# Patient Record
Sex: Female | Born: 2005 | Race: Black or African American | Hispanic: No | Marital: Single | Smoking: Never smoker
Health system: Southern US, Community
[De-identification: ages and names within clinical notes are randomized; demographics above are authoritative.]

## PROBLEM LIST (undated history)

## (undated) DIAGNOSIS — J45909 Unspecified asthma, uncomplicated: Secondary | ICD-10-CM

## (undated) HISTORY — PX: HERNIA REPAIR: SHX51

---
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
## 2024-06-12 ENCOUNTER — Other Ambulatory Visit: Payer: Self-pay

## 2024-06-12 ENCOUNTER — Emergency Department (HOSPITAL_COMMUNITY): Admission: EM | Admit: 2024-06-12 | Discharge: 2024-06-12 | Disposition: A | Discharge status: Home / Self Care

## 2024-06-12 ENCOUNTER — Encounter (HOSPITAL_COMMUNITY): Payer: Self-pay

## 2024-06-12 ENCOUNTER — Emergency Department (HOSPITAL_COMMUNITY)

## 2024-06-12 DIAGNOSIS — Z79899 Other long term (current) drug therapy: Secondary | ICD-10-CM | POA: Insufficient documentation

## 2024-06-12 DIAGNOSIS — R0789 Other chest pain: Secondary | ICD-10-CM | POA: Insufficient documentation

## 2024-06-12 DIAGNOSIS — R079 Chest pain, unspecified: Secondary | ICD-10-CM

## 2024-06-12 LAB — CBC
HCT: 33.9 % — ABNORMAL LOW (ref 36.0–46.0)
Hemoglobin: 11.3 g/dL — ABNORMAL LOW (ref 12.0–15.0)
MCH: 29.4 pg (ref 26.0–34.0)
MCHC: 33.3 g/dL (ref 30.0–36.0)
MCV: 88.1 fL (ref 80.0–100.0)
Platelets: 381 K/uL (ref 150–400)
RBC: 3.85 MIL/uL — ABNORMAL LOW (ref 3.87–5.11)
RDW: 13.1 % (ref 11.5–15.5)
WBC: 5.1 K/uL (ref 4.0–10.5)
nRBC: 0 % (ref 0.0–0.2)

## 2024-06-12 LAB — BASIC METABOLIC PANEL WITH GFR
Anion gap: 7 (ref 5–15)
BUN: 10 mg/dL (ref 6–20)
CO2: 27 mmol/L (ref 22–32)
Calcium: 9.2 mg/dL (ref 8.9–10.3)
Chloride: 102 mmol/L (ref 98–111)
Creatinine, Ser: 0.82 mg/dL (ref 0.44–1.00)
GFR, Estimated: >60 mL/min (ref >60)
Glucose, Bld: 93 mg/dL (ref 70–99)
Potassium: 4.1 mmol/L (ref 3.5–5.1)
Sodium: 136 mmol/L (ref 135–145)

## 2024-06-12 LAB — D-DIMER, QUANTITATIVE: D-Dimer, Quant: 0.36 ug{FEU}/mL (ref 0.00–0.50)

## 2024-06-12 LAB — TROPONIN T, HIGH SENSITIVITY
Troponin T High Sensitivity: <15 ng/L (ref 0–19)
Troponin T High Sensitivity: <15 ng/L (ref 0–19)

## 2024-06-12 NOTE — ED Provider Notes (Signed)
 [place] EMERGENCY DEPARTMENT AT [facility] Provider Note   CSN: [id number] Arrival date & time: 06/12/24  1813     Patient presents with: Chest Pain   Katelyn Adams is a 18 y.o. female patient who presents to the emergency department today for further evaluation of left-sided chest pain that is been persistent and intermittent for the last year.  Patient was seen and evaluated by her pediatrician on 06/07/2024 for similar symptoms.  She was referred to cardiology and she has an appointment for next week.  Patient states that chest pain came back and lasted approximately an hour which prompted her arrival here.  She states typically the chest pain lasts only couple minutes.  Hurts worse to take deep breaths.  Does not hurt worse with movement.  Chest pain does sometimes radiate to the back.  Currently rates it 2/10 in severity.    Chest Pain      Prior to Admission medications   Medication Sig Start Date End Date Taking? Authorizing Provider  albuterol (VENTOLIN HFA) 108 (90 Base) MCG/ACT inhaler Inhale 2 puffs into the lungs every 6 (six) hours as needed for wheezing or shortness of breath.   Yes [provider]  cetirizine (ZYRTEC) 10 MG tablet Take 10 mg by mouth daily as needed for allergies or rhinitis.   Yes [provider]  hyoscyamine (LEVSIN SL) 0.125 MG SL tablet Place 0.125 mg under the tongue every 4 (four) hours as needed for cramping.   Yes [provider]  pantoprazole (PROTONIX) 20 MG tablet Take 20 mg by mouth daily as needed for heartburn or indigestion.   Yes [provider]  lidocaine  (LIDODERM ) 5 % Place 1 patch onto the skin daily. Remove & Discard patch within 12 hours or as directed by MD Patient not taking: Reported on 06/12/2024 08/29/20   Palumbo, April, MD  topiramate  (TOPAMAX ) 15 MG capsule Take 1 tablet at nighttime for one week then 2 tablets at nighttime Patient not taking: Reported on 06/12/2024  12/13/14   Susen Elsie DEL, MD    Allergies: Bean pod extract, Cabbage, Grass pollen([id number] vern), Pollen extract, and Other    Review of Systems  Cardiovascular:  Positive for chest pain.  All other systems reviewed and are negative.   Updated Vital Signs BP 122/87   Pulse 71   Temp 98.1 F (36.7 C) (Oral)   Resp 16   Ht 5' 3.5 (1.613 m)   Wt 59 kg   SpO2 100%   BMI 22.67 kg/m   Physical Exam Vitals and nursing note reviewed.  Constitutional:      General: She is not in acute distress.    Appearance: Normal appearance.  HENT:     Head: Normocephalic and atraumatic.  Eyes:     General:        Right eye: No discharge.        Left eye: No discharge.  Cardiovascular:     Comments: Regular rate and rhythm.  S1/S2 are distinct without any evidence of murmur, rubs, or gallops.  Radial pulses are 2+ bilaterally.  Dorsalis pedis pulses are 2+ bilaterally.  No evidence of pedal edema. Pulmonary:     Comments: Clear to auscultation bilaterally.  Normal effort.  No respiratory distress.  No evidence of wheezes, rales, or rhonchi heard throughout. Abdominal:     General: Abdomen is flat. Bowel sounds are normal. There is no distension.     Tenderness: There is no abdominal tenderness. There  is no guarding or rebound.  Musculoskeletal:        General: Normal range of motion.     Cervical back: Neck supple.  Skin:    General: Skin is warm and dry.     Findings: No rash.  Neurological:     General: No focal deficit present.     Mental Status: She is alert.  Psychiatric:        Mood and Affect: Mood normal.        Behavior: Behavior normal.     (all labs ordered are listed, but only abnormal results are displayed) Labs Reviewed  CBC - Abnormal; Notable for the following components:      Result Value   RBC 3.85 (*)    Hemoglobin 11.3 (*)    HCT 33.9 (*)    All other components within normal limits  BASIC METABOLIC PANEL WITH GFR  D-DIMER, QUANTITATIVE   TROPONIN T, HIGH SENSITIVITY  TROPONIN T, HIGH SENSITIVITY    EKG: EKG Interpretation Date/Time:  Tuesday June 12 2024 19:15:45 EST Ventricular Rate:  73 PR Interval:  143 QRS Duration:  90 QT Interval:  388 QTC Calculation: 428 R Axis:   78  Text Interpretation: Unknown rhythm, irregular rate No previous for comparison Confirmed by Gennaro Bouchard ([id number]) on 06/12/2024 7:25:43 PM  Radiology: [facility] Chest Port 1 View Result Date: 06/12/2024 EXAM: 1 VIEW(S) XRAY OF THE CHEST 06/12/2024 06:51:00 PM COMPARISON: None available. CLINICAL HISTORY: chest pain FINDINGS: LUNGS AND PLEURA: No focal pulmonary opacity. No pleural effusion. No pneumothorax. HEART AND MEDIASTINUM: No acute abnormality of the cardiac and mediastinal silhouettes. BONES AND SOFT TISSUES: No acute osseous abnormality. IMPRESSION: 1. No acute process. Electronically signed by: Dorethia Molt MD 06/12/2024 07:35 PM EST RP Workstation: [id number]     Procedures   Medications Ordered in the ED - No data to display  Medical Decision Making Katelyn Adams is a 18 y.o. female patient who presents to the emergency department today for further evaluation of chest pain. Exam without evidence of volume overload so doubt heart failure. EKG without signs of active ischemia. Given the timing of pain to ER presentation, single troponin normal delta troponin was normal so doubt NSTEMI. Presentation not consistent with acute PE (negative D-dimer), pneumothorax (not visualized on chest xr), thoracic aortic dissection, pericarditis, tamponade, pneumonia (no infectious symptoms, clear chest xr), myocarditis (no recent illness, neg trop). HEART score: 2 so plan to  discharge patient home with cardiology follow up. Mother is at bedside. She is comfortable with plan. All questions and concerns addressed. Strict return precautions given. She is safe for discharge at this time.       Final diagnoses:  Chest pain, unspecified type     ED Discharge Orders     None          Theotis Cameron HERO, [place] 06/12/24 2345    Gennaro Bouchard L, DO 06/14/24 1725

## 2024-06-12 NOTE — Discharge Instructions (Signed)
 As we discussed, I am less concerned that this is coming from your heart or lungs.  I would follow-up with cardiology with your appointment next week.  Make sure you get MyChart so you can give them the records if they cannot see it.  Likely return to the emergency department for any worsening symptoms you might have.

## 2024-06-12 NOTE — ED Triage Notes (Signed)
 Chest pain that started 1 hour ago. Pain is underneath left breast, sometimes radiates into back. Pt has had this pain before, was referred to cardiologist. Has not seen cardiologist yet. No cardiac hx

## 2024-06-12 NOTE — ED Provider Triage Note (Signed)
 Emergency Medicine Provider Triage Evaluation Note  Katelyn Adams , a 18 y.o. female  was evaluated in triage.  Pt complains of lower substernal chest pain, no radiation, occasionally feels pain in her back.  Worsens with deep inspiration.  Has been present off and on for several months however has had no definitive diagnosis.  Reason for presenting today is that pain is worsened over the last several days, is not intermittent and is more persistent.  Only medical history is of asthma.  Does not worsen with physical exertion, does not worsen with oral intake.  Does increase with supine positioning.  Review of Systems  Positive: As above Negative:   Physical Exam  BP 122/83   Pulse 69   Temp 98 F (36.7 C) (Oral)   Resp 16   Ht 5' 3.5 (1.613 m)   Wt 59 kg   SpO2 100%   BMI 22.67 kg/m  Gen:   Awake, no distress   Resp:  Normal effort  MSK:   Moves extremities without difficulty  Other:    Medical Decision Making  Medically screening exam initiated at 6:39 PM.  Appropriate orders placed.  Katelyn Adams was informed that the remainder of the evaluation will be completed by another provider, this initial triage assessment does not replace that evaluation, and the importance of remaining in the ED until their evaluation is complete.  Given alteration with respiratory pattern, low suspicion for possible PE however D-dimer obtained to evaluate.  Further labs to evaluate for chest pain ordered.  EKG ordered, chest x-ray ordered.   Katelyn Adams, Katelyn Adams 06/12/24 [phone]
